# Patient Record
Sex: Male | Born: 1980 | Race: White | Hispanic: No | Marital: Single | Smoking: Current every day smoker
Health system: Southern US, Community
[De-identification: ages and names within clinical notes are randomized; demographics above are authoritative.]

## PROBLEM LIST (undated history)

## (undated) ENCOUNTER — Emergency Department: Admission: EM | Payer: Self-pay | Source: Home / Self Care

## (undated) DIAGNOSIS — K449 Diaphragmatic hernia without obstruction or gangrene: Secondary | ICD-10-CM

## (undated) DIAGNOSIS — M549 Dorsalgia, unspecified: Secondary | ICD-10-CM

## (undated) DIAGNOSIS — G8929 Other chronic pain: Secondary | ICD-10-CM

---

## 2002-11-19 ENCOUNTER — Emergency Department (HOSPITAL_COMMUNITY): Admission: EM | Admit: 2002-11-19 | Discharge: 2002-11-19 | Payer: Self-pay

## 2002-12-01 ENCOUNTER — Emergency Department (HOSPITAL_COMMUNITY): Admission: EM | Admit: 2002-12-01 | Discharge: 2002-12-02 | Payer: Self-pay | Admitting: Emergency Medicine

## 2002-12-26 ENCOUNTER — Emergency Department (HOSPITAL_COMMUNITY): Admission: EM | Admit: 2002-12-26 | Discharge: 2002-12-26 | Payer: Self-pay

## 2003-09-01 ENCOUNTER — Other Ambulatory Visit: Payer: Self-pay

## 2004-05-05 ENCOUNTER — Other Ambulatory Visit: Payer: Self-pay

## 2004-05-05 ENCOUNTER — Emergency Department: Payer: Self-pay | Admitting: Emergency Medicine

## 2004-05-06 ENCOUNTER — Emergency Department: Payer: Self-pay | Admitting: Emergency Medicine

## 2004-06-08 ENCOUNTER — Emergency Department: Payer: Self-pay | Admitting: Emergency Medicine

## 2005-07-19 ENCOUNTER — Emergency Department: Payer: Self-pay | Admitting: Emergency Medicine

## 2006-04-12 ENCOUNTER — Other Ambulatory Visit: Payer: Self-pay

## 2006-04-12 ENCOUNTER — Emergency Department: Payer: Self-pay | Admitting: Emergency Medicine

## 2006-04-13 ENCOUNTER — Ambulatory Visit: Payer: Self-pay | Admitting: Emergency Medicine

## 2007-07-12 ENCOUNTER — Emergency Department: Payer: Self-pay | Admitting: Emergency Medicine

## 2008-11-25 ENCOUNTER — Emergency Department: Payer: Self-pay | Admitting: Emergency Medicine

## 2008-12-10 ENCOUNTER — Emergency Department: Payer: Self-pay | Admitting: Emergency Medicine

## 2008-12-26 ENCOUNTER — Emergency Department: Payer: Self-pay | Admitting: Emergency Medicine

## 2010-12-16 ENCOUNTER — Emergency Department: Payer: Self-pay | Admitting: Emergency Medicine

## 2011-12-07 ENCOUNTER — Emergency Department: Payer: Self-pay | Admitting: Emergency Medicine

## 2012-11-28 ENCOUNTER — Emergency Department: Payer: Self-pay | Admitting: Internal Medicine

## 2012-11-28 LAB — CBC
HCT: 44.3 % (ref 40.0–52.0)
HGB: 15.3 g/dL (ref 13.0–18.0)
MCHC: 34.5 g/dL (ref 32.0–36.0)
MCV: 85 fL (ref 80–100)
Platelet: 260 10*3/uL (ref 150–440)
RBC: 5.19 10*6/uL (ref 4.40–5.90)
RDW: 13.2 % (ref 11.5–14.5)

## 2012-11-29 ENCOUNTER — Emergency Department: Payer: Self-pay | Admitting: Emergency Medicine

## 2013-03-03 ENCOUNTER — Emergency Department: Payer: Self-pay | Admitting: Emergency Medicine

## 2013-03-03 LAB — CBC WITH DIFFERENTIAL/PLATELET
Basophil #: 0.1 10*3/uL (ref 0.0–0.1)
Basophil %: 1.1 %
Eosinophil #: 0.4 10*3/uL (ref 0.0–0.7)
Eosinophil %: 5.6 %
HGB: 14.7 g/dL (ref 13.0–18.0)
Lymphocyte %: 24.5 %
MCHC: 35.5 g/dL (ref 32.0–36.0)
MCV: 86 fL (ref 80–100)
Monocyte %: 8.7 %
Platelet: 219 10*3/uL (ref 150–440)
RBC: 4.83 10*6/uL (ref 4.40–5.90)
RDW: 12.2 % (ref 11.5–14.5)

## 2013-03-03 LAB — BASIC METABOLIC PANEL
BUN: 12 mg/dL (ref 7–18)
Calcium, Total: 9.1 mg/dL (ref 8.5–10.1)
Co2: 29 mmol/L (ref 21–32)
EGFR (African American): >60
EGFR (Non-African Amer.): >60
Osmolality: 283 (ref 275–301)
Potassium: 3.6 mmol/L (ref 3.5–5.1)

## 2014-01-12 ENCOUNTER — Emergency Department: Payer: Self-pay | Admitting: Emergency Medicine

## 2014-01-22 ENCOUNTER — Emergency Department: Payer: Self-pay | Admitting: Emergency Medicine

## 2014-01-24 ENCOUNTER — Ambulatory Visit: Payer: Self-pay | Admitting: Podiatry

## 2014-06-18 ENCOUNTER — Emergency Department: Payer: Self-pay | Admitting: Emergency Medicine

## 2014-08-28 ENCOUNTER — Emergency Department
Admit: 2014-08-28 | Disposition: A | Discharge status: Home / Self Care | Payer: Self-pay | Admitting: Emergency Medicine

## 2014-09-26 ENCOUNTER — Emergency Department
Admission: EM | Admit: 2014-09-26 | Discharge: 2014-09-26 | Disposition: A | Discharge status: Home / Self Care | Payer: Medicaid Other | Attending: Emergency Medicine | Admitting: Emergency Medicine

## 2014-09-26 ENCOUNTER — Encounter: Payer: Self-pay | Admitting: *Deleted

## 2014-09-26 DIAGNOSIS — Y9289 Other specified places as the place of occurrence of the external cause: Secondary | ICD-10-CM | POA: Diagnosis not present

## 2014-09-26 DIAGNOSIS — Z79899 Other long term (current) drug therapy: Secondary | ICD-10-CM | POA: Diagnosis not present

## 2014-09-26 DIAGNOSIS — S29019A Strain of muscle and tendon of unspecified wall of thorax, initial encounter: Secondary | ICD-10-CM

## 2014-09-26 DIAGNOSIS — Y990 Civilian activity done for income or pay: Secondary | ICD-10-CM | POA: Insufficient documentation

## 2014-09-26 DIAGNOSIS — Y9389 Activity, other specified: Secondary | ICD-10-CM | POA: Diagnosis not present

## 2014-09-26 DIAGNOSIS — G8929 Other chronic pain: Secondary | ICD-10-CM

## 2014-09-26 DIAGNOSIS — M542 Cervicalgia: Secondary | ICD-10-CM | POA: Insufficient documentation

## 2014-09-26 DIAGNOSIS — X58XXXA Exposure to other specified factors, initial encounter: Secondary | ICD-10-CM | POA: Diagnosis not present

## 2014-09-26 DIAGNOSIS — M549 Dorsalgia, unspecified: Secondary | ICD-10-CM

## 2014-09-26 DIAGNOSIS — Z720 Tobacco use: Secondary | ICD-10-CM | POA: Diagnosis not present

## 2014-09-26 DIAGNOSIS — M546 Pain in thoracic spine: Secondary | ICD-10-CM | POA: Diagnosis present

## 2014-09-26 DIAGNOSIS — S29012A Strain of muscle and tendon of back wall of thorax, initial encounter: Secondary | ICD-10-CM | POA: Diagnosis not present

## 2014-09-26 MED ORDER — KETOROLAC TROMETHAMINE 60 MG/2ML IM SOLN
60.0000 mg | Freq: Once | INTRAMUSCULAR | Status: AC
  Administered 2014-09-26: 60 mg via INTRAMUSCULAR

## 2014-09-26 MED ORDER — HYDROMORPHONE HCL 1 MG/ML IJ SOLN
INTRAMUSCULAR | Status: AC
  Filled 2014-09-26: qty 1

## 2014-09-26 MED ORDER — KETOROLAC TROMETHAMINE 60 MG/2ML IM SOLN
INTRAMUSCULAR | Status: AC
Start: 2014-09-26 — End: 2014-09-26
  Filled 2014-09-26: qty 2

## 2014-09-26 MED ORDER — HYDROMORPHONE HCL 1 MG/ML IJ SOLN
1.0000 mg | Freq: Once | INTRAMUSCULAR | Status: AC
Start: 2014-09-26 — End: 2014-09-26
  Administered 2014-09-26: 1 mg via INTRAMUSCULAR

## 2014-09-26 MED ORDER — ORPHENADRINE CITRATE 30 MG/ML IJ SOLN
INTRAMUSCULAR | Status: AC
  Filled 2014-09-26: qty 2

## 2014-09-26 MED ORDER — METHOCARBAMOL 750 MG PO TABS: 750.0000 mg | ORAL_TABLET | Freq: Four times a day (QID) | ORAL | Status: DC

## 2014-09-26 MED ORDER — ORPHENADRINE CITRATE 30 MG/ML IJ SOLN
60.0000 mg | Freq: Once | INTRAMUSCULAR | Status: AC
  Administered 2014-09-26: 60 mg via INTRAMUSCULAR

## 2014-09-26 MED ORDER — IBUPROFEN 800 MG PO TABS: 800.0000 mg | ORAL_TABLET | Freq: Three times a day (TID) | ORAL | Status: DC | PRN

## 2014-09-26 MED ORDER — TRAMADOL HCL 50 MG PO TABS: 50.0000 mg | ORAL_TABLET | Freq: Four times a day (QID) | ORAL | Status: DC | PRN

## 2014-09-26 NOTE — ED Notes (Signed)
Pt here with c/o "pinched nerve" in neck.  Advises his neck and upper back started hurting last night.  Advises he has had this happen before.

## 2014-09-26 NOTE — ED Notes (Signed)
Pt expressed request for more pain meds at this time, states it did not help the pain. PA Ron made aware and no further orders given at this time.

## 2014-09-26 NOTE — ED Notes (Signed)
Pt placed on med hold. Verbalized understanding, will be d/c at 1430.

## 2014-09-26 NOTE — ED Notes (Signed)
Complaining of neck pain. Indicates it is on the neck and across the shoulders. Difficulty raising arms above head.

## 2014-09-26 NOTE — Discharge Instructions (Signed)
Take medications as directed and follow up with [facility].

## 2014-09-26 NOTE — ED Provider Notes (Signed)
[facility] Emergency Department Provider Note  ____________________________________________  Time seen: Approximately 1:39 PM  I have reviewed the triage vital signs and the nursing notes.   HISTORY  Chief Complaint Back Pain    HPI Maclin Lacretia NicksW Laveda Normanoney is a 34 y.o. male complaining of neck and upper back pain for 2 days. Patient cannot give aprovocative incident but works as a Corporate investment bankerconstruction worker. States the pain is at the base of his neck radiating to both of the scapular area and down to mid back. Patient is rating the pain as a 7/10. Patient states pain increases with movement of the upper extremities over his head. Patient denies any loss of sensation or loss of strength. Patient is concerned because this is the third incident in the past year for this complaint   History reviewed. No pertinent past medical history.  There are no active problems to display for this patient.   History reviewed. No pertinent past surgical history.  Current Outpatient Rx  Name  Route  Sig  Dispense  Refill  . ibuprofen (ADVIL,MOTRIN) 800 MG tablet   Oral   Take 1 tablet (800 mg total) by mouth every 8 (eight) hours as needed for moderate pain.   15 tablet   0   . methocarbamol (ROBAXIN-750) 750 MG tablet   Oral   Take 1 tablet (750 mg total) by mouth 4 (four) times daily.   20 tablet   0   . traMADol (ULTRAM) 50 MG tablet   Oral   Take 1 tablet (50 mg total) by mouth every 6 (six) hours as needed for moderate pain.   12 tablet   0     Allergies Review of patient's allergies indicates no known allergies.  No family history on file.  Social History History  Substance Use Topics  . Smoking status: Current Every Day Smoker  . Smokeless tobacco: Never Used  . Alcohol Use: Yes     Comment: social    Review of Systems Constitutional: No fever/chills Eyes: No visual changes. ENT: No sore throat. Cardiovascular: Denies chest pain. Respiratory: Denies  shortness of breath. Gastrointestinal: No abdominal pain.  No nausea, no vomiting.  No diarrhea.  No constipation. Genitourinary: Negative for dysuria. Musculoskeletal: Positive for bilateral scapular pain and midthoracic pain. Skin: Negative for rash. Neurological: Negative for headaches, focal weakness or numbness. Psychiatric:Negative Endocrine:Negative Hematological/Lymphatic: Allergic/Immunilogical: Negative 10-point ROS otherwise negative.  ____________________________________________   PHYSICAL EXAM:  VITAL SIGNS: ED Triage Vitals  Enc Vitals Group     BP 09/26/14 1209 114/88 mmHg     Pulse Rate 09/26/14 1209 76     Resp --      Temp 09/26/14 1209 98.1 F (36.7 C)     Temp Source 09/26/14 1209 Oral     SpO2 09/26/14 1209 100 %     Weight 09/26/14 1209 205 lb (92.987 kg)     Height 09/26/14 1209 5\' 9"  (1.753 m)     Head Cir --      Peak Flow --      Pain Score 09/26/14 1210 7     Pain Loc --      Pain Edu? --      Excl. in GC? --     Constitutional: Alert and oriented. Well appearing and in no acute distress. Eyes: Conjunctivae are normal. PERRL. EOMI. Head: Atraumatic. Nose: No congestion/rhinnorhea. Mouth/Throat: Mucous membranes are moist.  Oropharynx non-erythematous. Neck: No stridor.  Decreased range of motion with lateral movement  secondary to complain of pain. Hematological/Lymphatic/Immunilogical: No cervical lymphadenopathy. Cardiovascular: Normal rate, regular rhythm. Grossly normal heart sounds.  Good peripheral circulation. Respiratory: Normal respiratory effort.  No retractions. Lungs CTAB. Gastrointestinal: Soft and nontender. No distention. No abdominal bruits. No CVA tenderness. Genitourinary: Not examined Musculoskeletal: No obvious deformity of the extremity decreased range of motion of overhead reaching. Guarding with  palpation bilateral scapular area. No lower extremity tenderness nor edema.  No joint effusions. Neurologic:  Normal  speech and language. No gross focal neurologic deficits are appreciated. Speech is normal. No gait instability. Skin:  Skin is warm, dry and intact. No rash noted. Psychiatric: Mood and affect are normal. Speech and behavior are normal.  ____________________________________________   LABS (all labs ordered are listed, but only abnormal results are displayed)  Labs Reviewed - No data to display ____________________________________________  EKG   ____________________________________________  RADIOLOGY   ____________________________________________   PROCEDURES  Procedure(s) performed: None  Critical Care performed: No  ____________________________________________   INITIAL IMPRESSION / ASSESSMENT AND PLAN / ED COURSE  Pertinent labs & imaging results that were available during my care of the patient were reviewed by me and considered in my medical decision making (see chart for details).  Muscle strain upper back ____________________________________________   FINAL CLINICAL IMPRESSION(S) / ED DIAGNOSES  Final diagnoses:  Thoracic myofascial strain, initial encounter  Chronic back pain greater than 3 months duration      Joni ReiningRonald K Makina Skow, PA-C 09/26/14 1351  Sharman CheekPhillip Stafford, MD 09/27/14 [phone]

## 2015-01-03 ENCOUNTER — Encounter: Payer: Self-pay | Admitting: Emergency Medicine

## 2015-01-03 ENCOUNTER — Emergency Department: Payer: Medicaid Other

## 2015-01-03 ENCOUNTER — Emergency Department
Admission: EM | Admit: 2015-01-03 | Discharge: 2015-01-03 | Disposition: A | Discharge status: Home / Self Care | Payer: Medicaid Other | Attending: Emergency Medicine | Admitting: Emergency Medicine

## 2015-01-03 ENCOUNTER — Other Ambulatory Visit: Payer: Self-pay

## 2015-01-03 DIAGNOSIS — S46912A Strain of unspecified muscle, fascia and tendon at shoulder and upper arm level, left arm, initial encounter: Secondary | ICD-10-CM | POA: Insufficient documentation

## 2015-01-03 DIAGNOSIS — Z720 Tobacco use: Secondary | ICD-10-CM | POA: Diagnosis not present

## 2015-01-03 DIAGNOSIS — S4992XA Unspecified injury of left shoulder and upper arm, initial encounter: Secondary | ICD-10-CM | POA: Diagnosis present

## 2015-01-03 DIAGNOSIS — Y9289 Other specified places as the place of occurrence of the external cause: Secondary | ICD-10-CM | POA: Diagnosis not present

## 2015-01-03 DIAGNOSIS — Z79899 Other long term (current) drug therapy: Secondary | ICD-10-CM | POA: Insufficient documentation

## 2015-01-03 DIAGNOSIS — Y990 Civilian activity done for income or pay: Secondary | ICD-10-CM | POA: Insufficient documentation

## 2015-01-03 DIAGNOSIS — Y9389 Activity, other specified: Secondary | ICD-10-CM | POA: Diagnosis not present

## 2015-01-03 DIAGNOSIS — X58XXXA Exposure to other specified factors, initial encounter: Secondary | ICD-10-CM | POA: Diagnosis not present

## 2015-01-03 MED ORDER — NAPROXEN 500 MG PO TBEC: 500.0000 mg | DELAYED_RELEASE_TABLET | Freq: Two times a day (BID) | ORAL | Status: DC

## 2015-01-03 MED ORDER — CYCLOBENZAPRINE HCL 10 MG PO TABS: 10.0000 mg | ORAL_TABLET | Freq: Three times a day (TID) | ORAL | Status: DC | PRN

## 2015-01-03 MED ORDER — OXYCODONE-ACETAMINOPHEN 5-325 MG PO TABS: 1.0000 | ORAL_TABLET | ORAL | Status: DC | PRN

## 2015-01-03 NOTE — ED Notes (Addendum)
Patient to ED with c/o left shoulder pain that radiates down into left arm, left back and left side of chest. Patient reports this pain started while he was at work yesterday, works [employer] representative. Pain is worse with movement.

## 2015-01-03 NOTE — Discharge Instructions (Signed)

## 2015-01-03 NOTE — ED Provider Notes (Signed)
[facility] Emergency Department Provider Note  ____________________________________________  Time seen: Approximately 6:22 PM  I have reviewed the triage vital signs and the nursing notes.   HISTORY  Chief Complaint Shoulder Pain    HPI Shawn Green is a 34 y.o. male presents for evaluation of left shoulder pain times this morning. 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 Patient states pain is worse with movement. Denies any chest pain or cardiac pain.   History reviewed. No pertinent past medical history.  There are no active problems to display for this patient.   History reviewed. No pertinent past surgical history.  Current Outpatient Rx  Name  Route  Sig  Dispense  Refill  . cyclobenzaprine (FLEXERIL) 10 MG tablet   Oral   Take 1 tablet (10 mg total) by mouth every 8 (eight) hours as needed for muscle spasms.   30 tablet   1   . ibuprofen (ADVIL,MOTRIN) 800 MG tablet   Oral   Take 1 tablet (800 mg total) by mouth every 8 (eight) hours as needed for moderate pain.   15 tablet   0   . methocarbamol (ROBAXIN-750) 750 MG tablet   Oral   Take 1 tablet (750 mg total) by mouth 4 (four) times daily.   20 tablet   0   . naproxen (EC NAPROSYN) 500 MG EC tablet   Oral   Take 1 tablet (500 mg total) by mouth 2 (two) times daily with a meal.   60 tablet   0   . oxyCODONE-acetaminophen (ROXICET) 5-325 MG per tablet   Oral   Take 1-2 tablets by mouth every 4 (four) hours as needed for severe pain.   15 tablet   0   . traMADol (ULTRAM) 50 MG tablet   Oral   Take 1 tablet (50 mg total) by mouth every 6 (six) hours as needed for moderate pain.   12 tablet   0     Allergies Review of patient's allergies indicates no known allergies.  History reviewed. No pertinent family history.  Social History Social History  Substance Use Topics  . Smoking status: Current Every Day Smoker  . Smokeless tobacco: Never  Used  . Alcohol Use: Yes     Comment: social    Review of Systems Constitutional: No fever/chills Eyes: No visual changes. ENT: No sore throat. Cardiovascular: Denies chest pain. Respiratory: Denies shortness of breath. Gastrointestinal: No abdominal pain.  No nausea, no vomiting.  No diarrhea.  No constipation. Genitourinary: Negative for dysuria. Musculoskeletal: Positive left shoulder pain Skin: Negative for rash. Neurological: Negative for headaches, focal weakness or numbness.  10-point ROS otherwise negative.  ____________________________________________   PHYSICAL EXAM:  VITAL SIGNS: ED Triage Vitals  Enc Vitals Group     BP 01/03/15 1658 117/77 mmHg     Pulse Rate 01/03/15 1658 91     Resp 01/03/15 1658 20     Temp 01/03/15 1658 97.8 F (36.6 C)     Temp Source 01/03/15 1658 Oral     SpO2 01/03/15 1658 99 %     Weight 01/03/15 1658 205 lb (92.987 kg)     Height 01/03/15 1658 5\' 9"  (1.753 m)     Head Cir --      Peak Flow --      Pain Score 01/03/15 1659 7     Pain Loc --      Pain Edu? --  Excl. in GC? --     Constitutional: Alert and oriented. Well appearing and in no acute distress. Eyes: Conjunctivae are normal. PERRL. EOMI. Head: Atraumatic. Nose: No congestion/rhinnorhea. Mouth/Throat: Mucous membranes are moist.  Oropharynx non-erythematous. Neck: No stridor.   Cardiovascular: Normal rate, regular rhythm. Grossly normal heart sounds.  Good peripheral circulation. Respiratory: Normal respiratory effort.  No retractions. Lungs CTAB. Gastrointestinal: Soft and nontender. No distention. No abdominal bruits. No CVA tenderness. Musculoskeletal: Shoulder pendulum range of motion. Increased pain with abduction and extension Neurologic:  Normal speech and language. No gross focal neurologic deficits are appreciated. No gait instability. Skin:  Skin is warm, dry and intact. No rash noted. Psychiatric: Mood and affect are normal. Speech and behavior are  normal.  ____________________________________________   LABS (all labs ordered are listed, but only abnormal results are displayed)  Labs Reviewed - No data to display ____________________________________________   RADIOLOGY  Left shoulder radiology films were negative interpreted by radiologist and reviewed by myself. ____________________________________________   PROCEDURES  Procedure(s) performed: None  Critical Care performed: No  ____________________________________________   INITIAL IMPRESSION / ASSESSMENT AND PLAN / ED COURSE  Pertinent labs & imaging results that were available during my care of the patient were reviewed by me and considered in my medical decision making (see chart for details).  Acute left shoulder strain. Rx given for Naprosyn twice a day, Flexeril 10 mg 3 times a day. Work note for 1 day given. Patient follow-up with PCP or return to the ER as needed.  Patient voices no other emergency medical complaints at this visit. ____________________________________________   FINAL CLINICAL IMPRESSION(S) / ED DIAGNOSES  Final diagnoses:  Left shoulder strain, initial encounter      Evangeline Dakin, PA-C 01/03/15 1836  Phineas Semen, MD 01/03/15 [phone]

## 2015-03-03 ENCOUNTER — Encounter: Payer: Self-pay | Admitting: Emergency Medicine

## 2015-03-03 ENCOUNTER — Emergency Department
Admission: EM | Admit: 2015-03-03 | Discharge: 2015-03-03 | Disposition: A | Discharge status: Home / Self Care | Payer: Medicaid Other | Attending: Emergency Medicine | Admitting: Emergency Medicine

## 2015-03-03 DIAGNOSIS — G8929 Other chronic pain: Secondary | ICD-10-CM | POA: Diagnosis not present

## 2015-03-03 DIAGNOSIS — M549 Dorsalgia, unspecified: Secondary | ICD-10-CM | POA: Diagnosis present

## 2015-03-03 DIAGNOSIS — Z791 Long term (current) use of non-steroidal anti-inflammatories (NSAID): Secondary | ICD-10-CM | POA: Insufficient documentation

## 2015-03-03 DIAGNOSIS — Z720 Tobacco use: Secondary | ICD-10-CM | POA: Diagnosis not present

## 2015-03-03 DIAGNOSIS — M62830 Muscle spasm of back: Secondary | ICD-10-CM | POA: Diagnosis not present

## 2015-03-03 DIAGNOSIS — Z79899 Other long term (current) drug therapy: Secondary | ICD-10-CM | POA: Insufficient documentation

## 2015-03-03 HISTORY — DX: Other chronic pain: G89.29

## 2015-03-03 HISTORY — DX: Dorsalgia, unspecified: M54.9

## 2015-03-03 MED ORDER — OXYCODONE-ACETAMINOPHEN 5-325 MG PO TABS: 1.0000 | ORAL_TABLET | Freq: Four times a day (QID) | ORAL | Status: DC | PRN

## 2015-03-03 MED ORDER — MELOXICAM 15 MG PO TABS: 15.0000 mg | ORAL_TABLET | Freq: Every day | ORAL | Status: DC

## 2015-03-03 MED ORDER — CYCLOBENZAPRINE HCL 10 MG PO TABS: 10.0000 mg | ORAL_TABLET | Freq: Three times a day (TID) | ORAL | Status: DC | PRN

## 2015-03-03 NOTE — ED Provider Notes (Signed)
[facility] Emergency Department Provider Note  ____________________________________________  Time seen: Approximately 2:52 PM  I have reviewed the triage vital signs and the nursing notes.   HISTORY  Chief Complaint Back Pain    HPI Shawn Green is a 34 y.o. male who presents emergency department with a complaint of back pain. He states that he has chronic back pain issues after injuring he was "cutting trees". States that he routinely will have increased symptoms. He denies any recent injury. He denies any activity out of the ordinary. He denies any numbness or tingling. He states that the pain runs from mid back to his lower back. He states that the pain is a cramping/burning sensation. He reports it as severe with any movement.   Past Medical History  Diagnosis Date  . Chronic back pain     There are no active problems to display for this patient.   History reviewed. No pertinent past surgical history.  Current Outpatient Rx  Name  Route  Sig  Dispense  Refill  . cyclobenzaprine (FLEXERIL) 10 MG tablet   Oral   Take 1 tablet (10 mg total) by mouth 3 (three) times daily as needed for muscle spasms.   15 tablet   0   . ibuprofen (ADVIL,MOTRIN) 800 MG tablet   Oral   Take 1 tablet (800 mg total) by mouth every 8 (eight) hours as needed for moderate pain.   15 tablet   0   . meloxicam (MOBIC) 15 MG tablet   Oral   Take 1 tablet (15 mg total) by mouth daily.   30 tablet   0   . methocarbamol (ROBAXIN-750) 750 MG tablet   Oral   Take 1 tablet (750 mg total) by mouth 4 (four) times daily.   20 tablet   0   . naproxen (EC NAPROSYN) 500 MG EC tablet   Oral   Take 1 tablet (500 mg total) by mouth 2 (two) times daily with a meal.   60 tablet   0   . oxyCODONE-acetaminophen (ROXICET) 5-325 MG tablet   Oral   Take 1 tablet by mouth every 6 (six) hours as needed for severe pain.   10 tablet   0   . traMADol (ULTRAM) 50 MG tablet    Oral   Take 1 tablet (50 mg total) by mouth every 6 (six) hours as needed for moderate pain.   12 tablet   0     Allergies Review of patient's allergies indicates no known allergies.  History reviewed. No pertinent family history.  Social History Social History  Substance Use Topics  . Smoking status: Current Every Day Smoker  . Smokeless tobacco: Never Used  . Alcohol Use: Yes     Comment: social    Review of Systems Constitutional: No fever/chills Eyes: No visual changes. ENT: No sore throat. Cardiovascular: Denies chest pain. Respiratory: Denies shortness of breath. Gastrointestinal: No abdominal pain.  No nausea, no vomiting.  No diarrhea.  No constipation. Genitourinary: Negative for dysuria. Musculoskeletal: Endorses for back pain. Skin: Negative for rash. Neurological: Negative for headaches, focal weakness or numbness.  10-point ROS otherwise negative.  ____________________________________________   PHYSICAL EXAM:  VITAL SIGNS: ED Triage Vitals  Enc Vitals Group     BP 03/03/15 1429 107/65 mmHg     Pulse Rate 03/03/15 1429 95     Resp 03/03/15 1429 18     Temp 03/03/15 1429 98.6 F (37 C)  Temp Source 03/03/15 1429 Oral     SpO2 03/03/15 1429 99 %     Weight 03/03/15 1429 200 lb (90.719 kg)     Height 03/03/15 1429 5\' 9"  (1.753 m)     Head Cir --      Peak Flow --      Pain Score 03/03/15 1430 8     Pain Loc --      Pain Edu? --      Excl. in GC? --     Constitutional: Alert and oriented. Well appearing and in no acute distress. Eyes: Conjunctivae are normal. PERRL. EOMI. Head: Atraumatic. Nose: No congestion/rhinnorhea. Mouth/Throat: Mucous membranes are moist.  Oropharynx non-erythematous. Neck: No stridor.  No cervical spine tenderness to palpation. Cardiovascular: Normal rate, regular rhythm. Grossly normal heart sounds.  Good peripheral circulation. Respiratory: Normal respiratory effort.  No retractions. Lungs  CTAB. Gastrointestinal: Soft and nontender. No distention. No abdominal bruits. No CVA tenderness. Musculoskeletal: No lower extremity tenderness nor edema.  No joint effusions. Diffuse tenderness from the lumbar sacral region in the paraspinal muscles. No tenderness to palpation over spinal processes. No straight leg raise bilaterally. Sensation intact bilaterally. Pulses intact lower extremities bilaterally. Neurologic:  Normal speech and language. No gross focal neurologic deficits are appreciated. No gait instability. Skin:  Skin is warm, dry and intact. No rash noted. Psychiatric: Mood and affect are normal. Speech and behavior are normal.  ____________________________________________   LABS (all labs ordered are listed, but only abnormal results are displayed)  Labs Reviewed - No data to display ____________________________________________  EKG   ____________________________________________  RADIOLOGY   ____________________________________________   PROCEDURES  Procedure(s) performed: None  Critical Care performed: No  ____________________________________________   INITIAL IMPRESSION / ASSESSMENT AND PLAN / ED COURSE  Pertinent labs & imaging results that were available during my care of the patient were reviewed by me and considered in my medical decision making (see chart for details).  Patient's history, symptoms, and physical exam are consistent with muscle spasms in the lumbar and sacral paraspinal muscle groups. The patient will be placed on medications to relieve symptoms. I advised the patient to establish with primary care, orthopedics, or chiropractor for continued treatment. The patient verbalizes understanding and compliance with treatment plan. ____________________________________________   FINAL CLINICAL IMPRESSION(S) / ED DIAGNOSES  Final diagnoses:  Spasm of paraspinal muscle      Racheal PatchesJonathan D Semisi Biela, PA-C 03/03/15 1518  Darien Ramusavid W Kaminski,  MD 03/03/15 2046

## 2015-03-03 NOTE — ED Notes (Signed)
Reports chronic back pain, worse today. 

## 2015-03-03 NOTE — ED Notes (Signed)
NAD noted at time of D/C. Pt denies questions or concerns. Pt ambulatory to the lobby at this time.  

## 2015-03-03 NOTE — Discharge Instructions (Signed)
Back Exercises The following exercises strengthen the muscles that help to support the back. They also help to keep the lower back flexible. Doing these exercises can help to prevent back pain or lessen existing pain. If you have back pain or discomfort, try doing these exercises 2-3 times each day or as told by your health care provider. When the pain goes away, do them once each day, but increase the number of times that you repeat the steps for each exercise (do more repetitions). If you do not have back pain or discomfort, do these exercises once each day or as told by your health care provider. EXERCISES Single Knee to Chest Repeat these steps 3-5 times for each leg:  Lie on your back on a firm bed or the floor with your legs extended.  Bring one knee to your chest. Your other leg should stay extended and in contact with the floor.  Hold your knee in place by grabbing your knee or thigh.  Pull on your knee until you feel a gentle stretch in your lower back.  Hold the stretch for 10-30 seconds.  Slowly release and straighten your leg. Pelvic Tilt Repeat these steps 5-10 times:  Lie on your back on a firm bed or the floor with your legs extended.  Bend your knees so they are pointing toward the ceiling and your feet are flat on the floor.  Tighten your lower abdominal muscles to press your lower back against the floor. This motion will tilt your pelvis so your tailbone points up toward the ceiling instead of pointing to your feet or the floor.  With gentle tension and even breathing, hold this position for 5-10 seconds. Cat-Cow Repeat these steps until your lower back becomes more flexible:  Get into a hands-and-knees position on a firm surface. Keep your hands under your shoulders, and keep your knees under your hips. You may place padding under your knees for comfort.  Let your head hang down, and point your tailbone toward the floor so your lower back becomes rounded like the  back of a cat.  Hold this position for 5 seconds.  Slowly lift your head and point your tailbone up toward the ceiling so your back forms a sagging arch like the back of a cow.  Hold this position for 5 seconds. Press-Ups Repeat these steps 5-10 times:  Lie on your abdomen (face-down) on the floor.  Place your palms near your head, about shoulder-width apart.  While you keep your back as relaxed as possible and keep your hips on the floor, slowly straighten your arms to raise the top half of your body and lift your shoulders. Do not use your back muscles to raise your upper torso. You may adjust the placement of your hands to make yourself more comfortable.  Hold this position for 5 seconds while you keep your back relaxed.  Slowly return to lying flat on the floor. Bridges Repeat these steps 10 times:  Lie on your back on a firm surface.  Bend your knees so they are pointing toward the ceiling and your feet are flat on the floor.  Tighten your buttocks muscles and lift your buttocks off of the floor until your waist is at almost the same height as your knees. You should feel the muscles working in your buttocks and the back of your thighs. If you do not feel these muscles, slide your feet 1-2 inches farther away from your buttocks.  Hold this position for 3-5  seconds.  Slowly lower your hips to the starting position, and allow your buttocks muscles to relax completely. If this exercise is too easy, try doing it with your arms crossed over your chest. Abdominal Crunches Repeat these steps 5-10 times:  Lie on your back on a firm bed or the floor with your legs extended.  Bend your knees so they are pointing toward the ceiling and your feet are flat on the floor.  Cross your arms over your chest.  Tip your chin slightly toward your chest without bending your neck.  Tighten your abdominal muscles and slowly raise your trunk (torso) high enough to lift your shoulder blades a  tiny bit off of the floor. Avoid raising your torso higher than that, because it can put too much stress on your low back and it does not help to strengthen your abdominal muscles.  Slowly return to your starting position. Back Lifts Repeat these steps 5-10 times: 1. Lie on your abdomen (face-down) with your arms at your sides, and rest your forehead on the floor. 2. Tighten the muscles in your legs and your buttocks. 3. Slowly lift your chest off of the floor while you keep your hips pressed to the floor. Keep the back of your head in line with the curve in your back. Your eyes should be looking at the floor. 4. Hold this position for 3-5 seconds. 5. Slowly return to your starting position. SEEK MEDICAL CARE IF:  Your back pain or discomfort gets much worse when you do an exercise.  Your back pain or discomfort does not lessen within 2 hours after you exercise. If you have any of these problems, stop doing these exercises right away. Do not do them again unless your health care provider says that you can. SEEK IMMEDIATE MEDICAL CARE IF:  You develop sudden, severe back pain. If this happens, stop doing the exercises right away. Do not do them again unless your health care provider says that you can.   This information is not intended to replace advice given to you by your health care provider. Make sure you discuss any questions you have with your health care provider.   Document Released: 06/12/2004 Document Revised: 01/24/2015 Document Reviewed: 06/29/2014 Elsevier Interactive Patient Education 2016 [place] Injury Prevention Back injuries can be very painful. They can also be difficult to heal. After having one back injury, you are more likely to injure your back again. It is important to learn how to avoid injuring or re-injuring your back. The following tips can help you to prevent a back injury. WHAT SHOULD I KNOW ABOUT PHYSICAL FITNESS?  Exercise for 30 minutes per  day on most days of the week or as directed by your health care provider. Make sure to:  Do aerobic exercises, such as walking, jogging, biking, or swimming.  Do exercises that increase balance and strength, such as tai chi and yoga. These can decrease your risk of falling and injuring your back.  Do stretching exercises to help with flexibility.  Try to develop strong abdominal muscles. Your abdominal muscles provide a lot of the support that is needed by your back.  Maintain a healthy weight. This helps to decrease your risk of a back injury. WHAT SHOULD I KNOW ABOUT MY DIET?  Talk with your health care provider about your overall diet. Take supplements and vitamins only as directed by your health care provider.  Talk with your health care provider about how much calcium and  vitamin D you need each day. These nutrients help to prevent weakening of the bones (osteoporosis). Osteoporosis can cause broken (fractured) bones, which lead to back pain.  Include good sources of calcium in your diet, such as dairy products, green leafy vegetables, and products that have had calcium added to them (fortified).  Include good sources of vitamin D in your diet, such as milk and foods that are fortified with vitamin D. WHAT SHOULD I KNOW ABOUT MY POSTURE?  Sit up straight and stand up straight. Avoid leaning forward when you sit or hunching over when you stand.  Choose chairs that have good low-back (lumbar) support.  If you work at a desk, sit close to it so you do not need to lean over. Keep your chin tucked in. Keep your neck drawn back, and keep your elbows bent at a right angle. Your arms should look like the letter "L."  Sit high and close to the steering wheel when you drive. Add a lumbar support to your car seat, if needed.  Avoid sitting or standing in one position for very long. Take breaks to get up, stretch, and walk around at least one time every hour. Take breaks every hour if you are  driving for long periods of time.  Sleep on your side with your knees slightly bent, or sleep on your back with a pillow under your knees. Do not lie on the front of your body to sleep. WHAT SHOULD I KNOW ABOUT LIFTING, TWISTING, AND REACHING? Lifting and Heavy Lifting  Avoid heavy lifting, especially repetitive heavy lifting. If you must do heavy lifting:  Stretch before lifting.  Work slowly.  Rest between lifts.  Use a tool such as a cart or a dolly to move objects if one is available.  Make several small trips instead of carrying one heavy load.  Ask for help when you need it, especially when moving big objects.  Follow these steps when lifting:  Stand with your feet shoulder-width apart.  Get as close to the object as you can. Do not try to pick up a heavy object that is far from your body.  Use handles or lifting straps if they are available.  Bend at your knees. Squat down, but keep your heels off the floor.  Keep your shoulders pulled back, your chin tucked in, and your back straight.  Lift the object slowly while you tighten the muscles in your legs, abdomen, and buttocks. Keep the object as close to the center of your body as possible.  Follow these steps when putting down a heavy load:  Stand with your feet shoulder-width apart.  Lower the object slowly while you tighten the muscles in your legs, abdomen, and buttocks. Keep the object as close to the center of your body as possible.  Keep your shoulders pulled back, your chin tucked in, and your back straight.  Bend at your knees. Squat down, but keep your heels off the floor.  Use handles or lifting straps if they are available. Twisting and Reaching  Avoid lifting heavy objects above your waist.  Do not twist at your waist while you are lifting or carrying a load. If you need to turn, move your feet.  Do not bend over without bending at your knees.  Avoid reaching over your head, across a table, or  for an object on a high surface. WHAT ARE SOME OTHER TIPS?  Avoid wet floors and icy ground. Keep sidewalks clear of ice to prevent  falls.  Do not sleep on a mattress that is too soft or too hard.  Keep items that are used frequently within easy reach.  Put heavier objects on shelves at waist level, and put lighter objects on lower or higher shelves.  Find ways to decrease your stress, such as exercise, massage, or relaxation techniques. Stress can build up in your muscles. Tense muscles are more vulnerable to injury.  Talk with your health care provider if you feel anxious or depressed. These conditions can make back pain worse.  Wear flat heel shoes with cushioned soles.  Avoid sudden movements.  Use both shoulder straps when carrying a backpack.  Do not use any tobacco products, including cigarettes, chewing tobacco, or electronic cigarettes. If you need help quitting, ask your health care provider.   This information is not intended to replace advice given to you by your health care provider. Make sure you discuss any questions you have with your health care provider.   Document Released: 06/12/2004 Document Revised: 09/19/2014 Document Reviewed: 05/09/2014 Elsevier Interactive Patient Education 2016 [employer]  Muscle Cramps and Spasms Muscle cramps and spasms occur when a muscle or muscles tighten and you have no control over this tightening (involuntary muscle contraction). They are a common problem and can develop in any muscle. The most common place is in the calf muscles of the leg. Both muscle cramps and muscle spasms are involuntary muscle contractions, but they also have differences:   Muscle cramps are sporadic and painful. They may last a few seconds to a quarter of an hour. Muscle cramps are often more forceful and last longer than muscle spasms.  Muscle spasms may or may not be painful. They may also last just a few seconds or much longer. CAUSES  It is  uncommon for cramps or spasms to be due to a serious underlying problem. In many cases, the cause of cramps or spasms is unknown. Some common causes are:   Overexertion.   Overuse from repetitive motions (doing the same thing over and over).   Remaining in a certain position for a long period of time.   Improper preparation, form, or technique while performing a sport or activity.   Dehydration.   Injury.   Side effects of some medicines.   Abnormally low levels of the salts and ions in your blood (electrolytes), especially potassium and calcium. This could happen if you are taking water pills (diuretics) or you are pregnant.  Some underlying medical problems can make it more likely to develop cramps or spasms. These include, but are not limited to:   Diabetes.   Parkinson disease.   Hormone disorders, such as thyroid problems.   Alcohol abuse.   Diseases specific to muscles, joints, and bones.   Blood vessel disease where not enough blood is getting to the muscles.  HOME CARE INSTRUCTIONS   Stay well hydrated. Drink enough water and fluids to keep your urine clear or pale yellow.  It may be helpful to massage, stretch, and relax the affected muscle.  For tight or tense muscles, use a warm towel, heating pad, or hot shower water directed to the affected area.  If you are sore or have pain after a cramp or spasm, applying ice to the affected area may relieve discomfort.  Put ice in a plastic bag.  Place a towel between your skin and the bag.  Leave the ice on for 15-20 minutes, 03-04 times a day.  Medicines used to treat a known  cause of cramps or spasms may help reduce their frequency or severity. Only take over-the-counter or prescription medicines as directed by your caregiver. SEEK MEDICAL CARE IF:  Your cramps or spasms get more severe, more frequent, or do not improve over time.  MAKE SURE YOU:   Understand these instructions.  Will watch your  condition.  Will get help right away if you are not doing well or get worse.   This information is not intended to replace advice given to you by your health care provider. Make sure you discuss any questions you have with your health care provider.   Document Released: 10/25/2001 Document Revised: 08/30/2012 Document Reviewed: 04/21/2012 Elsevier Interactive Patient Education [employer].

## 2015-03-15 ENCOUNTER — Encounter: Payer: Self-pay | Admitting: Emergency Medicine

## 2015-03-15 ENCOUNTER — Emergency Department
Admission: EM | Admit: 2015-03-15 | Discharge: 2015-03-15 | Disposition: A | Discharge status: Home / Self Care | Payer: Medicaid Other | Attending: Emergency Medicine | Admitting: Emergency Medicine

## 2015-03-15 DIAGNOSIS — M5412 Radiculopathy, cervical region: Secondary | ICD-10-CM | POA: Insufficient documentation

## 2015-03-15 DIAGNOSIS — Z720 Tobacco use: Secondary | ICD-10-CM | POA: Diagnosis not present

## 2015-03-15 DIAGNOSIS — M25511 Pain in right shoulder: Secondary | ICD-10-CM | POA: Diagnosis present

## 2015-03-15 MED ORDER — CYCLOBENZAPRINE HCL 10 MG PO TABS: 10.0000 mg | ORAL_TABLET | Freq: Three times a day (TID) | ORAL | Status: DC | PRN

## 2015-03-15 MED ORDER — PREDNISONE 10 MG (21) PO TBPK: ORAL_TABLET | ORAL | Status: DC

## 2015-03-15 MED ORDER — TRAMADOL HCL 50 MG PO TABS
50.0000 mg | ORAL_TABLET | Freq: Four times a day (QID) | ORAL | Status: DC | PRN
Start: 2015-03-15 — End: 2017-06-16

## 2015-03-15 NOTE — ED Provider Notes (Signed)
[facility] Emergency Department Provider Note ____________________________________________  Time seen: Approximately 10:29 AM  I have reviewed the triage vital signs and the nursing notes.   HISTORY  Chief Complaint Shoulder Pain   HPI Shawn Green is a 34 y.o. male who presents to the emergency department for evaluation of neck, back, and shoulder pain that radiates into his right hand. He has been evaluated for this pain several times, but has not followed up with PCP or orthopedics. He reports the symptoms today are different because the pain is radiating down the entire right arm. He has not been taking anything for pain. He denies new injury, although has been lifting heavy objects at work.   Past Medical History  Diagnosis Date  . Chronic back pain     There are no active problems to display for this patient.   History reviewed. No pertinent past surgical history.  Current Outpatient Rx  Name  Route  Sig  Dispense  Refill  . cyclobenzaprine (FLEXERIL) 10 MG tablet   Oral   Take 1 tablet (10 mg total) by mouth 3 (three) times daily as needed for muscle spasms.   30 tablet   0   . predniSONE (STERAPRED UNI-PAK 21 TAB) 10 MG (21) TBPK tablet      Take 6 tablets on day 1 Take 5 tablets on day 2 Take 4 tablets on day 3 Take 3 tablets on day 4 Take 2 tablets on day 5 Take 1 tablet on day 6   21 tablet   0   . traMADol (ULTRAM) 50 MG tablet   Oral   Take 1 tablet (50 mg total) by mouth every 6 (six) hours as needed.   9 tablet   0     Allergies Review of patient's allergies indicates no known allergies.  No family history on file.  Social History Social History  Substance Use Topics  . Smoking status: Current Every Day Smoker  . Smokeless tobacco: Never Used  . Alcohol Use: Yes     Comment: social    Review of Systems Constitutional: No recent illness. Eyes: No visual changes. ENT: No sore throat. Cardiovascular: Denies  chest pain or palpitations. Respiratory: Denies shortness of breath. Gastrointestinal: No abdominal pain.  Genitourinary: Negative for dysuria. Musculoskeletal: Pain in right neck, back, shoulder, and right arm Skin: Negative for rash. Neurological: Negative for headaches, focal weakness. Positive for numbness. 10-point ROS otherwise negative.  ____________________________________________   PHYSICAL EXAM:  VITAL SIGNS: ED Triage Vitals  Enc Vitals Group     BP 03/15/15 1016 116/70 mmHg     Pulse Rate 03/15/15 1016 71     Resp 03/15/15 1016 18     Temp 03/15/15 1016 98.4 F (36.9 C)     Temp src --      SpO2 03/15/15 1016 97 %     Weight 03/15/15 1011 200 lb (90.719 kg)     Height 03/15/15 1011 5\' 9"  (1.753 m)     Head Cir --      Peak Flow --      Pain Score 03/15/15 1011 8     Pain Loc --      Pain Edu? --      Excl. in GC? --     Constitutional: Alert and oriented. Well appearing and in no acute distress. Eyes: Conjunctivae are normal. EOMI. Head: Atraumatic. Nose: No congestion/rhinnorhea. Neck: No stridor.  Respiratory: Normal respiratory effort.   Musculoskeletal: FROM of  right shoulder, elbow and hand. Limited ROM of neck due to pain, especially turning to the left. Neurologic:  Normal speech and language. No gross focal neurologic deficits are appreciated. Speech is normal. No gait instability. Skin:  Skin is warm, dry and intact. Atraumatic. Psychiatric: Mood and affect are normal. Speech and behavior are normal.  ____________________________________________   LABS (all labs ordered are listed, but only abnormal results are displayed)  Labs Reviewed - No data to display ____________________________________________  RADIOLOGY  Not indicated ____________________________________________   PROCEDURES  Procedure(s) performed:    ____________________________________________   INITIAL IMPRESSION / ASSESSMENT AND PLAN / ED COURSE  Pertinent labs &  imaging results that were available during my care of the patient were reviewed by me and considered in my medical decision making (see chart for details).  Patient was advised that he will need to call his PCP for a referral to orthopedics. He was advised that the ER does not treat chronic pain.  He will be given Rx for prednisone, flexeril, and Tramadol (#9).  ____________________________________________   FINAL CLINICAL IMPRESSION(S) / ED DIAGNOSES  Final diagnoses:  Cervical radicular pain       Chinita Pester, FNP 03/15/15 1047  Emily Filbert, MD 03/15/15 [phone]

## 2015-03-15 NOTE — ED Notes (Signed)
Having right post shoulder pain which radiates into right arm  Denies injury but has been seen for same

## 2015-03-15 NOTE — Discharge Instructions (Signed)

## 2017-01-25 ENCOUNTER — Emergency Department
Admission: EM | Admit: 2017-01-25 | Discharge: 2017-01-25 | Disposition: A | Discharge status: Home / Self Care | Payer: Medicaid Other | Attending: Emergency Medicine | Admitting: Emergency Medicine

## 2017-01-25 ENCOUNTER — Emergency Department: Payer: Medicaid Other

## 2017-01-25 DIAGNOSIS — Z79899 Other long term (current) drug therapy: Secondary | ICD-10-CM | POA: Insufficient documentation

## 2017-01-25 DIAGNOSIS — F17210 Nicotine dependence, cigarettes, uncomplicated: Secondary | ICD-10-CM | POA: Insufficient documentation

## 2017-01-25 DIAGNOSIS — S40011A Contusion of right shoulder, initial encounter: Secondary | ICD-10-CM

## 2017-01-25 DIAGNOSIS — W500XXA Accidental hit or strike by another person, initial encounter: Secondary | ICD-10-CM | POA: Insufficient documentation

## 2017-01-25 DIAGNOSIS — Y9351 Activity, roller skating (inline) and skateboarding: Secondary | ICD-10-CM | POA: Diagnosis not present

## 2017-01-25 DIAGNOSIS — Y999 Unspecified external cause status: Secondary | ICD-10-CM | POA: Insufficient documentation

## 2017-01-25 DIAGNOSIS — S4991XA Unspecified injury of right shoulder and upper arm, initial encounter: Secondary | ICD-10-CM | POA: Diagnosis present

## 2017-01-25 DIAGNOSIS — Y929 Unspecified place or not applicable: Secondary | ICD-10-CM | POA: Insufficient documentation

## 2017-01-25 DIAGNOSIS — S46911A Strain of unspecified muscle, fascia and tendon at shoulder and upper arm level, right arm, initial encounter: Secondary | ICD-10-CM | POA: Diagnosis not present

## 2017-01-25 MED ORDER — MELOXICAM 15 MG PO TABS: 15.0000 mg | ORAL_TABLET | Freq: Every day | ORAL | 0 refills | Status: DC

## 2017-01-25 NOTE — ED Triage Notes (Signed)
Pt states he was roller skating last night, tripped and fell onto another persons skates and is having right shoulder pain.

## 2017-01-25 NOTE — ED Provider Notes (Signed)
[facility] Emergency Department Provider Note  ____________________________________________  Time seen: Approximately 4:02 PM  I have reviewed the triage vital signs and the nursing notes.   HISTORY  Chief Complaint Shoulder Pain    HPI Shawn Green is a 36 y.o. male Who presents to emergency department complaining of right shoulder pain. Patient reports that he was roller skating with his kids when another skater collided with him causing him to fall on his right shoulder. Patient reports that pain was present immediately but has increased in pain over the intervening period of time.patient has tried no medications at home. Patient denies any numbness or tingling in his hands.patient did not hit his head or lose consciousness. Patient is reporting pain to both the anterior and posterior aspect of his shoulder.   Past Medical History:  Diagnosis Date  . Chronic back pain     There are no active problems to display for this patient.   History reviewed. No pertinent surgical history.  Prior to Admission medications   Medication Sig Start Date End Date Taking? Authorizing Provider  cyclobenzaprine (FLEXERIL) 10 MG tablet Take 1 tablet (10 mg total) by mouth 3 (three) times daily as needed for muscle spasms. 03/15/15   Triplett, Rulon Eisenmenger B, FNP  meloxicam (MOBIC) 15 MG tablet Take 1 tablet (15 mg total) by mouth daily. 01/25/17   Tasheem Elms, Delorise Royals, PA-C  predniSONE (STERAPRED UNI-PAK 21 TAB) 10 MG (21) TBPK tablet Take 6 tablets on day 1 Take 5 tablets on day 2 Take 4 tablets on day 3 Take 3 tablets on day 4 Take 2 tablets on day 5 Take 1 tablet on day 6 03/15/15   Triplett, Cari B, FNP  traMADol (ULTRAM) 50 MG tablet Take 1 tablet (50 mg total) by mouth every 6 (six) hours as needed. 03/15/15   Chinita Pester, FNP    Allergies Patient has no known allergies.  No family history on file.  Social History Social History  Substance Use Topics  .  Smoking status: Current Every Day Smoker  . Smokeless tobacco: Never Used  . Alcohol use Yes     Comment: social     Review of Systems  Constitutional: No fever/chills Eyes: No visual changes.  Cardiovascular: no chest pain. Respiratory: no cough. No SOB. Gastrointestinal: No abdominal pain.  No nausea, no vomiting.  Musculoskeletal: positive for right shoulder pain Skin: Negative for rash, abrasions, lacerations, ecchymosis. Neurological: Negative for headaches, focal weakness or numbness. 10-point ROS otherwise negative.  ____________________________________________   PHYSICAL EXAM:  VITAL SIGNS: ED Triage Vitals [01/25/17 1523]  Enc Vitals Group     BP 123/74     Pulse Rate 83     Resp 17     Temp 98.3 F (36.8 C)     Temp Source Oral     SpO2 99 %     Weight 210 lb (95.3 kg)     Height  (1.753 m)     Head Circumference      Peak Flow      Pain Score 8     Pain Loc      Pain Edu?      Excl. in GC?      Constitutional: Alert and oriented. Well appearing and in no acute distress. Eyes: Conjunctivae are normal. PERRL. EOMI. Head: Atraumatic. Neck: No stridor.  No cervical spine tenderness to palpation.  Cardiovascular: Normal rate, regular rhythm. Normal S1 and S2.  Good peripheral circulation. Respiratory:  Normal respiratory effort without tachypnea or retractions. Lungs CTAB. Good air entry to the bases with no decreased or absent breath sounds. Musculoskeletal: Limited range of motion of the right shoulder due to pain. No visible deformities, ecchymosis, lacerations. Patient is mildly tender to palpation over the lateral aspect of the scapula and the acromioclavicular joint space. No palpable abnormalities. No other tenderness to palpation. Radial pulse intact distally. Sensation intact all 5 digits. Neurologic:  Normal speech and language. No gross focal neurologic deficits are appreciated.  Skin:  Skin is warm, dry and intact. No rash noted. Psychiatric:  Mood and affect are normal. Speech and behavior are normal. Patient exhibits appropriate insight and judgement.   ____________________________________________   LABS (all labs ordered are listed, but only abnormal results are displayed)  Labs Reviewed - No data to display ____________________________________________  EKG   ____________________________________________  RADIOLOGY Festus BarrenI, Masiah Lewing D Nina Mondor, personally viewed and evaluated these images (plain radiographs) as part of my medical decision making, as well as reviewing the written report by the radiologist.  Dg Shoulder Right  Result Date: 01/25/2017 CLINICAL DATA:  Right shoulder pain after fall skating last night. EXAM: RIGHT SHOULDER - 2+ VIEW COMPARISON:  None. FINDINGS: There is no evidence of fracture or dislocation. There is no evidence of arthropathy or other focal bone abnormality. Soft tissues are unremarkable. IMPRESSION: Normal right shoulder. Electronically Signed   By: Lupita RaiderJames  Green Jr, M.D.   On: 01/25/2017 15:53    ____________________________________________    PROCEDURES  Procedure(s) performed:    .Splint Application Date/Time: 01/25/2017 4:15 PM Performed by: Gala RomneyUTHRIELL, Ivanna Kocak D Authorized by: Gala RomneyUTHRIELL, Florestine Carmical D   Consent:    Consent obtained:  Verbal   Consent given by:  Patient   Risks discussed:  Pain Pre-procedure details:    Sensation:  Normal Procedure details:    Laterality:  Right   Location:  Shoulder   Shoulder:  R shoulder   Supplies:  Sling Post-procedure details:    Pain:  Improved   Sensation:  Normal   Patient tolerance of procedure:  Tolerated well, no immediate complications      Medications - No data to display   ____________________________________________   INITIAL IMPRESSION / ASSESSMENT AND PLAN / ED COURSE  Pertinent labs & imaging results that were available during my care of the patient were reviewed by me and considered in my medical decision making  (see chart for details).  Review of the [place] CSRS was performed in accordance of the [facility] prior to dispensing any controlled drugs.     Patient's diagnosis is consistent with right shoulder contusion and strain. X-ray reveals no acute osseous abnormality. Exam was reassuring for no indication of acute muscular tear. Patient is given sling for comfort. Patient will be discharged home with prescriptions for meloxicam for symptom control. Patient is to follow up with primary care or orthopedics as needed or otherwise directed. Patient is given ED precautions to return to the ED for any worsening or new symptoms.     ____________________________________________  FINAL CLINICAL IMPRESSION(S) / ED DIAGNOSES  Final diagnoses:  Contusion of right shoulder, initial encounter  Strain of right shoulder, initial encounter      NEW MEDICATIONS STARTED DURING THIS VISIT:  New Prescriptions   MELOXICAM (MOBIC) 15 MG TABLET    Take 1 tablet (15 mg total) by mouth daily.        This chart was dictated using voice recognition software/Dragon. Despite best efforts to proofread, errors can occur which  can change the meaning. Any change was purely unintentional.    Racheal Patches, PA-C 01/25/17 1619    Jeanmarie Plant, MD 01/25/17 [phone]

## 2017-06-16 ENCOUNTER — Emergency Department
Admission: EM | Admit: 2017-06-16 | Discharge: 2017-06-16 | Disposition: A | Discharge status: Home / Self Care | Payer: Medicaid Other | Attending: Emergency Medicine | Admitting: Emergency Medicine

## 2017-06-16 ENCOUNTER — Other Ambulatory Visit: Payer: Self-pay

## 2017-06-16 ENCOUNTER — Encounter: Payer: Self-pay | Admitting: Emergency Medicine

## 2017-06-16 DIAGNOSIS — Z8719 Personal history of other diseases of the digestive system: Secondary | ICD-10-CM | POA: Diagnosis not present

## 2017-06-16 DIAGNOSIS — R070 Pain in throat: Secondary | ICD-10-CM | POA: Diagnosis present

## 2017-06-16 DIAGNOSIS — K209 Esophagitis, unspecified without bleeding: Secondary | ICD-10-CM

## 2017-06-16 DIAGNOSIS — F17200 Nicotine dependence, unspecified, uncomplicated: Secondary | ICD-10-CM | POA: Insufficient documentation

## 2017-06-16 MED ORDER — GI COCKTAIL ~~LOC~~
ORAL | Status: AC
  Filled 2017-06-16: qty 30

## 2017-06-16 MED ORDER — PANTOPRAZOLE SODIUM 20 MG PO TBEC: 20.0000 mg | DELAYED_RELEASE_TABLET | Freq: Every day | ORAL | 0 refills | Status: AC

## 2017-06-16 MED ORDER — GI COCKTAIL ~~LOC~~
30.0000 mL | Freq: Once | ORAL | Status: AC
  Administered 2017-06-16: 30 mL via ORAL

## 2017-06-16 MED ORDER — FAMOTIDINE IN NACL 20-0.9 MG/50ML-% IV SOLN
20.0000 mg | Freq: Once | INTRAVENOUS | Status: AC
  Administered 2017-06-16: 20 mg via INTRAVENOUS
  Filled 2017-06-16: qty 50

## 2017-06-16 NOTE — ED Triage Notes (Signed)
Presents with sore throat for the past 3-4 days  Pain increases with swallowing  States he drank a lot of alcohol this past weekend  And developed some vomiting  Throat is red   No white patches noted

## 2017-06-16 NOTE — ED Provider Notes (Signed)
[facility] Emergency Department Provider Note  ____________________________________________   First MD Initiated Contact with Patient 06/16/17 1229     (approximate)  I have reviewed the triage vital signs and the nursing notes.   HISTORY  Chief Complaint Sore Throat  HPI Shawn Green is a 37 y.o. male is here with complaint of reflux symptoms.  Patient states he has had a sore throat for the last 3-4 days that increases with swallowing.  He has a history of hiatal hernia and in the past was diagnosed with GERD.  He was given a prescription for medication which he never picked up.  He states that he has had an episode of vomiting.  He admits to drinking 1/5 of tequila 3 nights ago.  He has not taken any over-the-counter medication for his throat.  He denies any fever or chills.  He has not been exposed to strep throat.   Past Medical History:  Diagnosis Date  . Chronic back pain     There are no active problems to display for this patient.   History reviewed. No pertinent surgical history.  Prior to Admission medications   Medication Sig Start Date End Date Taking? Authorizing Provider  pantoprazole (PROTONIX) 20 MG tablet Take 1 tablet (20 mg total) by mouth daily. 06/16/17 06/16/18  Tommi RumpsSummers, Edyn Qazi L, PA-C    Allergies Patient has no known allergies.  No family history on file.  Social History Social History   Tobacco Use  . Smoking status: Current Every Day Smoker  . Smokeless tobacco: Never Used  Substance Use Topics  . Alcohol use: Yes    Comment: social  . Drug use: No    Review of Systems Constitutional: No fever/chills Eyes: No visual changes. ENT: Positive sore throat. Cardiovascular: Denies chest pain. Respiratory: Denies shortness of breath. Gastrointestinal: No abdominal pain.  No nausea, positive vomiting.  Neurological: Negative for headaches, focal weakness or  numbness. ____________________________________________   PHYSICAL EXAM:  VITAL SIGNS: ED Triage Vitals  Enc Vitals Group     BP 06/16/17 1224 124/79     Pulse Rate 06/16/17 1224 68     Resp --      Temp 06/16/17 1224 97.9 F (36.6 C)     Temp Source 06/16/17 1224 Oral     SpO2 06/16/17 1224 97 %     Weight --      Height --      Head Circumference --      Peak Flow --      Pain Score 06/16/17 1232 7     Pain Loc --      Pain Edu? --      Excl. in GC? --    Constitutional: Alert and oriented. Well appearing and in no acute distress. Eyes: Conjunctivae are normal.  Head: Atraumatic. Nose: No congestion/rhinnorhea. Mouth/Throat: Mucous membranes are moist.  Oropharynx mild injection but no erythema or exudate was noted. Neck: No stridor.   Hematological/Lymphatic/Immunilogical: No cervical lymphadenopathy. Cardiovascular: Normal rate, regular rhythm. Grossly normal heart sounds.  Good peripheral circulation. Respiratory: Normal respiratory effort.  No retractions. Lungs CTAB. Gastrointestinal: Soft and nontender. No distention.  Bowel sounds normoactive and no epigastric tenderness is noted to palpation. Musculoskeletal: Moves upper and lower extremities without any difficulty and normal gait was noted. Neurologic:  Normal speech and language. No gross focal neurologic deficits are appreciated. No gait instability. Skin:  Skin is warm, dry and intact. No rash noted. Psychiatric: Mood and affect are  normal. Speech and behavior are normal.  ____________________________________________   LABS (all labs ordered are listed, but only abnormal results are displayed)  Labs Reviewed - No data to display  PROCEDURES  Procedure(s) performed: None  Procedures  Critical Care performed: No  ____________________________________________   INITIAL IMPRESSION / ASSESSMENT AND PLAN / ED COURSE  Patient was given GI cocktail with viscous lidocaine and Pepcid 20 mg IV while in the  department.  We discussed no alcoholic beverages in the future and no spicy foods.  He is to follow-up with his PCP at [facility] if any continued problems for referral to a gastroenterologist for further studies.  Patient was discharged with a prescription for Protonix 20 mg to be taken once a day.  He is encouraged to make an appointment for follow-up in 2 weeks. ____________________________________________   FINAL CLINICAL IMPRESSION(S) / ED DIAGNOSES  Final diagnoses:  Chemical esophagitis  H/O gastroesophageal reflux (GERD)     ED Discharge Orders        Ordered    pantoprazole (PROTONIX) 20 MG tablet  Daily     06/16/17 1458       Note:  This document was prepared using Dragon voice recognition software and may include unintentional dictation errors.    Tommi Rumps, PA-C 06/16/17 1508    Pershing Proud Myra Rude, MD 06/16/17 [phone]

## 2017-06-16 NOTE — Discharge Instructions (Signed)
Begin taking Protonix once a day every day and make an appointment to be [facility] for [facility].  If not improving you may need to see a gastroenterologist.  Your doctor at [facility] will need to make the referral for you.  Do not drink any alcohol or eat spicy food during this time.

## 2017-07-30 ENCOUNTER — Emergency Department: Payer: Medicaid Other

## 2017-07-30 ENCOUNTER — Encounter: Payer: Self-pay | Admitting: Emergency Medicine

## 2017-07-30 ENCOUNTER — Emergency Department
Admission: EM | Admit: 2017-07-30 | Discharge: 2017-07-30 | Disposition: A | Discharge status: Home / Self Care | Payer: Medicaid Other | Attending: Emergency Medicine | Admitting: Emergency Medicine

## 2017-07-30 DIAGNOSIS — R339 Retention of urine, unspecified: Secondary | ICD-10-CM | POA: Insufficient documentation

## 2017-07-30 DIAGNOSIS — M5432 Sciatica, left side: Secondary | ICD-10-CM | POA: Diagnosis not present

## 2017-07-30 DIAGNOSIS — F17210 Nicotine dependence, cigarettes, uncomplicated: Secondary | ICD-10-CM | POA: Insufficient documentation

## 2017-07-30 DIAGNOSIS — G8929 Other chronic pain: Secondary | ICD-10-CM | POA: Diagnosis not present

## 2017-07-30 DIAGNOSIS — M549 Dorsalgia, unspecified: Secondary | ICD-10-CM

## 2017-07-30 DIAGNOSIS — Z5309 Procedure and treatment not carried out because of other contraindication: Secondary | ICD-10-CM

## 2017-07-30 HISTORY — DX: Diaphragmatic hernia without obstruction or gangrene: K44.9

## 2017-07-30 LAB — URINALYSIS, COMPLETE (UACMP) WITH MICROSCOPIC
Bacteria, UA: NONE SEEN
Bilirubin Urine: NEGATIVE
GLUCOSE, UA: NEGATIVE mg/dL
HGB URINE DIPSTICK: NEGATIVE
KETONES UR: NEGATIVE mg/dL
Leukocytes, UA: NEGATIVE
NITRITE: NEGATIVE
PH: 6 (ref 5.0–8.0)
PROTEIN: NEGATIVE mg/dL
Specific Gravity, Urine: 1.024 (ref 1.005–1.030)
Squamous Epithelial / LPF: NONE SEEN

## 2017-07-30 LAB — CBC WITH DIFFERENTIAL/PLATELET
BASOS ABS: 0.1 10*3/uL (ref 0–0.1)
BASOS PCT: 1 %
EOS PCT: 8 %
Eosinophils Absolute: 0.5 10*3/uL (ref 0–0.7)
HEMATOCRIT: 43.5 % (ref 40.0–52.0)
Hemoglobin: 14.9 g/dL (ref 13.0–18.0)
LYMPHS PCT: 24 %
Lymphs Abs: 1.4 10*3/uL (ref 1.0–3.6)
MCH: 29.9 pg (ref 26.0–34.0)
MCHC: 34.2 g/dL (ref 32.0–36.0)
MCV: 87.4 fL (ref 80.0–100.0)
MONO ABS: 0.5 10*3/uL (ref 0.2–1.0)
MONOS PCT: 9 %
NEUTROS ABS: 3.4 10*3/uL (ref 1.4–6.5)
Neutrophils Relative %: 58 %
PLATELETS: 299 10*3/uL (ref 150–440)
RBC: 4.98 MIL/uL (ref 4.40–5.90)
RDW: 12.4 % (ref 11.5–14.5)
WBC: 5.9 10*3/uL (ref 3.8–10.6)

## 2017-07-30 LAB — BASIC METABOLIC PANEL
Anion gap: 8 (ref 5–15)
BUN: 12 mg/dL (ref 6–20)
CALCIUM: 8.4 mg/dL — AB (ref 8.9–10.3)
CO2: 24 mmol/L (ref 22–32)
CREATININE: 0.88 mg/dL (ref 0.61–1.24)
Chloride: 107 mmol/L (ref 101–111)
GFR calc Af Amer: >60 mL/min (ref >60)
GLUCOSE: 118 mg/dL — AB (ref 65–99)
Potassium: 4 mmol/L (ref 3.5–5.1)
Sodium: 139 mmol/L (ref 135–145)

## 2017-07-30 MED ORDER — LORAZEPAM 2 MG/ML IJ SOLN
INTRAMUSCULAR | Status: AC
  Administered 2017-07-30: 1 mg via INTRAVENOUS
  Filled 2017-07-30: qty 1

## 2017-07-30 MED ORDER — OXYCODONE-ACETAMINOPHEN 5-325 MG PO TABS
ORAL_TABLET | ORAL | Status: AC
  Filled 2017-07-30: qty 1

## 2017-07-30 MED ORDER — MORPHINE SULFATE (PF) 4 MG/ML IV SOLN
INTRAVENOUS | Status: AC
  Administered 2017-07-30: 4 mg via INTRAVENOUS
  Filled 2017-07-30: qty 1

## 2017-07-30 MED ORDER — CARISOPRODOL 350 MG PO TABS: 350.0000 mg | ORAL_TABLET | Freq: Three times a day (TID) | ORAL | 0 refills | Status: AC | PRN

## 2017-07-30 MED ORDER — MORPHINE SULFATE (PF) 4 MG/ML IV SOLN
4.0000 mg | Freq: Once | INTRAVENOUS | Status: AC
  Administered 2017-07-30: 4 mg via INTRAVENOUS

## 2017-07-30 MED ORDER — NAPROXEN 375 MG PO TABS: 375.0000 mg | ORAL_TABLET | Freq: Two times a day (BID) | ORAL | 0 refills | Status: AC | PRN

## 2017-07-30 MED ORDER — PREDNISONE 20 MG PO TABS
60.0000 mg | ORAL_TABLET | Freq: Once | ORAL | Status: AC
  Administered 2017-07-30: 60 mg via ORAL
  Filled 2017-07-30: qty 3

## 2017-07-30 MED ORDER — METHYLPREDNISOLONE 4 MG PO TBPK: ORAL_TABLET | ORAL | 0 refills | Status: AC

## 2017-07-30 MED ORDER — LORAZEPAM 2 MG/ML IJ SOLN
1.0000 mg | Freq: Once | INTRAMUSCULAR | Status: AC
  Administered 2017-07-30: 1 mg via INTRAVENOUS

## 2017-07-30 MED ORDER — OXYCODONE-ACETAMINOPHEN 5-325 MG PO TABS
1.0000 | ORAL_TABLET | Freq: Once | ORAL | Status: AC
  Administered 2017-07-30: 1 via ORAL

## 2017-07-30 MED ORDER — ONDANSETRON HCL 4 MG/2ML IJ SOLN
4.0000 mg | Freq: Once | INTRAMUSCULAR | Status: AC
  Administered 2017-07-30: 4 mg via INTRAVENOUS

## 2017-07-30 MED ORDER — ONDANSETRON HCL 4 MG/2ML IJ SOLN
INTRAMUSCULAR | Status: AC
  Administered 2017-07-30: 4 mg via INTRAVENOUS
  Filled 2017-07-30: qty 2

## 2017-07-30 NOTE — ED Notes (Signed)
Pt left without being discharged. Pt called back and informed that he needed to return to have IV removal verified and discharge paperwork provided.

## 2017-07-30 NOTE — ED Notes (Signed)
Pt has urine cup waiting for urine specimen at this time

## 2017-07-30 NOTE — ED Notes (Signed)
Patient transported to MRI 

## 2017-07-30 NOTE — ED Provider Notes (Signed)
[facility] Emergency Department Provider Note  ___________________________________________   First MD Initiated Contact with Patient 07/30/17 1631     (approximate)  I have reviewed the triage vital signs and the nursing notes.   HISTORY  Chief Complaint Back Pain and Dysuria   HPI Davison Lacretia Nicks Selley is a 37 y.o. male with a history of chronic back pain and a hiatal hernia who is presenting to the emergency department today with worsening back pain especially to the lower back over the past 2 days and urinary retention.  He says that he feels like he has to urinate but is only passing a small amount of urine.  He says this has been going on for years but has worsened over the past 2 days ever since his wife pushed on his back and felt a small knot in the left pelvic area.  He denies any injury.  Says that he is been working in [employer] but denies any recent heavy or lifting been normal.  Says that the back pain is 7 out of 8 and extends from his lower lumbar and sacral region all the way to his neck.  Denies IV drug use.  Denies recent fever or chills.  Says that he is also had a 20 pound weight loss over the past several weeks but says that he has not been eating well since having the flu several weeks ago.   Past Medical History:  Diagnosis Date  . Chronic back pain   . Hiatal hernia     There are no active problems to display for this patient.   History reviewed. No pertinent surgical history.  Prior to Admission medications   Medication Sig Start Date End Date Taking? Authorizing Provider  pantoprazole (PROTONIX) 20 MG tablet Take 1 tablet (20 mg total) by mouth daily. 06/16/17 06/16/18  Tommi Rumps, PA-C    Allergies Patient has no known allergies.  No family history on file.  Social History Social History   Tobacco Use  . Smoking status: Current Every Day Smoker  . Smokeless tobacco: Never Used  Substance Use Topics  . Alcohol use:  Yes    Comment: social  . Drug use: No    Review of Systems  Constitutional: No fever/chills Eyes: No visual changes. ENT: No sore throat. Cardiovascular: Denies chest pain. Respiratory: Denies shortness of breath. Gastrointestinal: No abdominal pain.  No nausea, no vomiting.  No diarrhea.  No constipation. Genitourinary: As above Musculoskeletal: As above Skin: Negative for rash. Neurological: Negative for headaches, focal weakness or numbness.   ____________________________________________   PHYSICAL EXAM:  VITAL SIGNS: ED Triage Vitals  Enc Vitals Group     BP 07/30/17 1618 (!) 111/94     Pulse Rate 07/30/17 1618 90     Resp 07/30/17 1618 16     Temp 07/30/17 1618 98.2 F (36.8 C)     Temp Source 07/30/17 1618 Oral     SpO2 07/30/17 1618 100 %     Weight 07/30/17 1619 200 lb (90.7 kg)     Height 07/30/17 1619 5\' 9"  (1.753 m)     Head Circumference --      Peak Flow --      Pain Score 07/30/17 1618 8     Pain Loc --      Pain Edu? --      Excl. in GC? --     Constitutional: Alert and oriented. Well appearing and in no acute distress. Eyes: Conjunctivae  are normal.  Head: Atraumatic. Nose: No congestion/rhinnorhea. Mouth/Throat: Mucous membranes are moist.  Neck: No stridor.   Cardiovascular: Normal rate, regular rhythm. Grossly normal heart sounds.  Good peripheral circulation with equal and bilateral posterior tibial pulses. Respiratory: Normal respiratory effort.  No retractions. Lungs CTAB. Gastrointestinal: Soft and nontender. No distention. No CVA tenderness. Musculoskeletal: No lower extremity tenderness nor edema.  No joint effusions.  Tenderness to palpation diffusely to the midline of the back including the lumbar, thoracic and cervical spines.  No step-off.  I do not palpate the "knot" that was described in the sacral region on the left. Neurologic:  Normal speech and language. No gross focal neurologic deficits are appreciated.  5 out of 5 strength  bilateral lower extremities.  No saddle anesthesia. Skin:  Skin is warm, dry and intact. No rash noted. Psychiatric: Mood and affect are normal. Speech and behavior are normal.  ____________________________________________   LABS (all labs ordered are listed, but only abnormal results are displayed)  Labs Reviewed  URINALYSIS, COMPLETE (UACMP) WITH MICROSCOPIC - Abnormal; Notable for the following components:      Result Value   Color, Urine YELLOW (*)    APPearance CLEAR (*)    All other components within normal limits  BASIC METABOLIC PANEL - Abnormal; Notable for the following components:   Glucose, Bld 118 (*)    Calcium 8.4 (*)    All other components within normal limits  CBC WITH DIFFERENTIAL/PLATELET   ____________________________________________  EKG   ____________________________________________  RADIOLOGY  MRI of the cervical spine without acute abnormality.  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  Mild bilateral C7 foraminal stenosis related to disc bulge and  uncovertebral hypertrophy.  Normal thoracic and lumbar spine.   ____________________________________________   PROCEDURES  Procedure(s) performed:   Procedures  Critical Care performed:   ____________________________________________   INITIAL IMPRESSION / ASSESSMENT AND PLAN / ED COURSE  Pertinent labs & imaging results that were available during my care of the patient were reviewed by me and considered in my medical decision making (see chart for details).  DDX: Spinal compression, radiculopathy, spinal fracture, herniated disc, spinal stenosis, urinary retention As part of my medical decision making, I reviewed the following data within the electronic MEDICAL RECORD NUMBER Notes from prior ED visits  ----------------------------------------- 9:17 PM on 07/30/2017 -----------------------------------------  Patient able to ambulate about the room  with a small limp.  Says that the pain does shoot down the back of his left leg to his foot.  I will treat the patient for sciatica with steroids and also give him a muscle relaxer.  I also advised that he take Naprosyn for pain control.  He is now saying that he has had these symptoms once a month for the past 8 years and was one seen at [facility] of the symptoms but has stopped seeing them.  He never went to physical therapy but he said it was recommended at one point.  I recommended that he get back in with primary care and possibly restart discussions with physical therapy.  No signs of cauda equina at this time.  Discussed the MRI results as well as the treatment plan with the patient and family and they are understanding and willing to comply.  Patient also refused Foley catheter but appears to be able to void. ____________________________________________   FINAL CLINICAL IMPRESSION(S) / ED DIAGNOSES  Sciatica.  Back pain.    NEW MEDICATIONS STARTED DURING  THIS VISIT:  New Prescriptions   No medications on file     Note:  This document was prepared using Dragon voice recognition software and may include unintentional dictation errors.     Myrna BlazerSchaevitz, Kyndall Amero Matthew, MD 07/30/17 2118

## 2017-07-30 NOTE — ED Triage Notes (Signed)
Patient reports lower back pain and "slow urination" for "years".  Patient reports the pain and urinary issues are increasing in severity.  Patient reports feeling very tired all the time.  Patient also reports numbness and tingling that radiates into his legs.  Patient states, "I talked to somebody I know who I a nurse, and she thinks it's sounds like a cyst or a tumor and thought I probably need an MRI."  Patient's girlfriend reports massaging patient's back recently and felt a "lump that moved."  Patient ambulatory to triage slowly with no obvious distress.

## 2018-06-29 ENCOUNTER — Other Ambulatory Visit: Payer: Self-pay

## 2018-06-29 ENCOUNTER — Emergency Department
Admission: EM | Admit: 2018-06-29 | Discharge: 2018-06-29 | Disposition: A | Discharge status: Home / Self Care | Payer: Medicaid Other | Attending: Emergency Medicine | Admitting: Emergency Medicine

## 2018-06-29 ENCOUNTER — Encounter: Payer: Self-pay | Admitting: Emergency Medicine

## 2018-06-29 DIAGNOSIS — M25511 Pain in right shoulder: Secondary | ICD-10-CM | POA: Insufficient documentation

## 2018-06-29 DIAGNOSIS — Z5321 Procedure and treatment not carried out due to patient leaving prior to being seen by health care provider: Secondary | ICD-10-CM | POA: Insufficient documentation

## 2018-06-29 NOTE — ED Triage Notes (Signed)
Patient ambulatory to triage with steady gait, without difficulty or distress noted; pt reports x 2 days having right shoulder pain, st seen for same before and dx with "pinched nerve"; st yr ago had an injury where shoulder was dislocated

## 2018-06-29 NOTE — ED Notes (Signed)
Pt c/o right shoulder pain after lifting heavy objects at home. Pt reports that this a recurrent issue.

## 2018-06-29 NOTE — ED Notes (Signed)
Pt left without being seen.

## 2019-08-12 IMAGING — MR MR THORACIC SPINE W/O CM
4 of 6 series · 27 of 48 positions shown · non-contrast
Comparison: None available.

CLINICAL DATA: Initial evaluation for worsening lower back pain,
difficulty with micturition, and bilateral lower extremity numbness
and tingling.

EXAM:
MRI CERVICAL, THORACIC AND LUMBAR SPINE WITHOUT CONTRAST
TECHNIQUE: Multiplanar and multiecho pulse sequences of the cervical spine, to
include the craniocervical junction and cervicothoracic junction,
and thoracic and lumbar spine, were obtained without intravenous
contrast.

[Series 4: T2 · sagittal · 3.0mm · 1.25mm/px · 7 of 19 slices shown (1 of 2)]
[im 1/19]
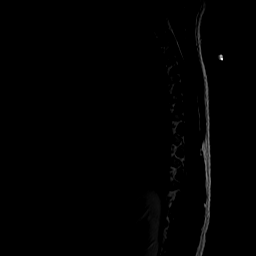
[im 4/19]
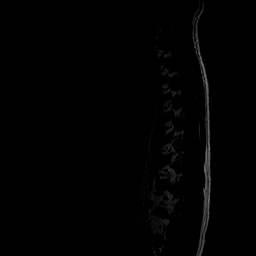
[im 7/19]
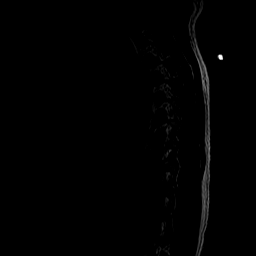
[im 10/19]
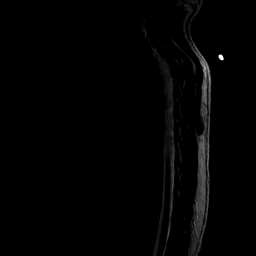
[im 13/19]
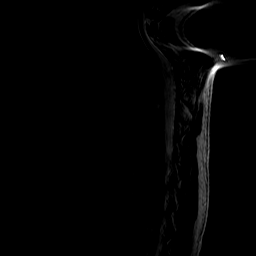
[im 16/19]
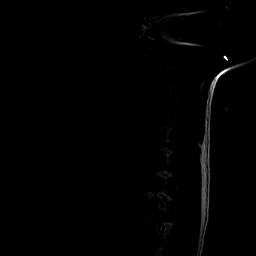
[im 19/19]
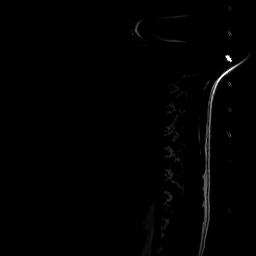

[Series 5: T1 · sagittal · 3.0mm · 0.62mm/px · 7 of 19 slices shown]
[im 1/19]
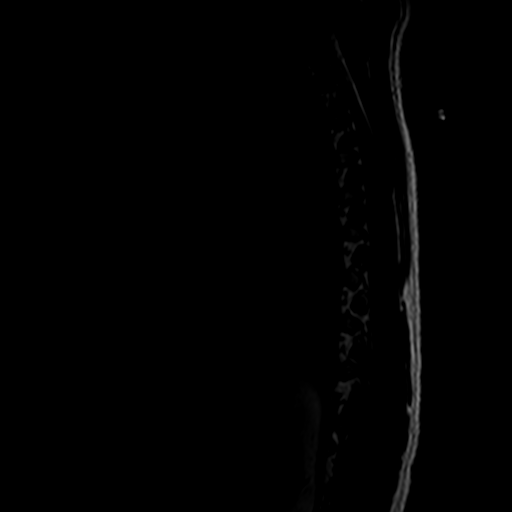
[im 4/19]
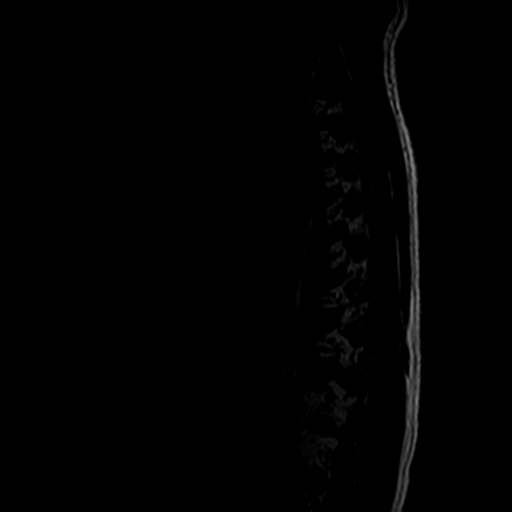
[im 7/19]
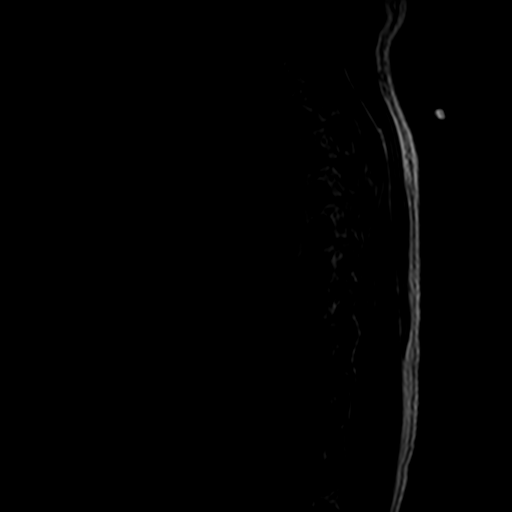
[im 10/19]
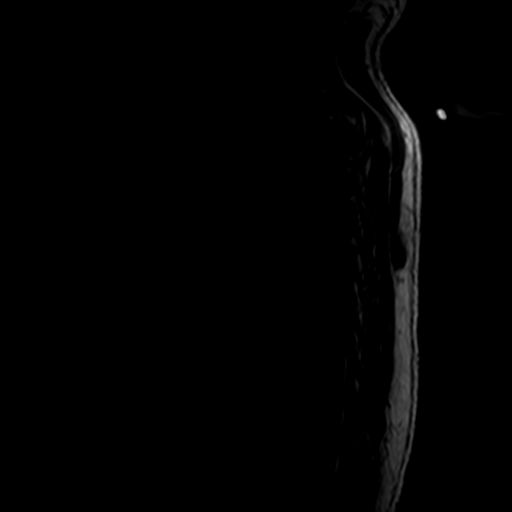
[im 13/19]
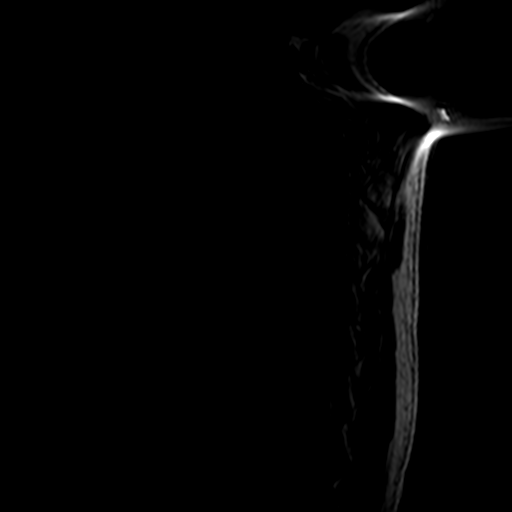
[im 16/19]
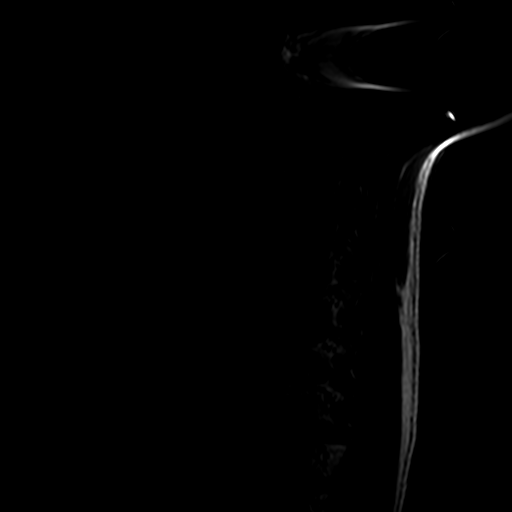
[im 19/19]
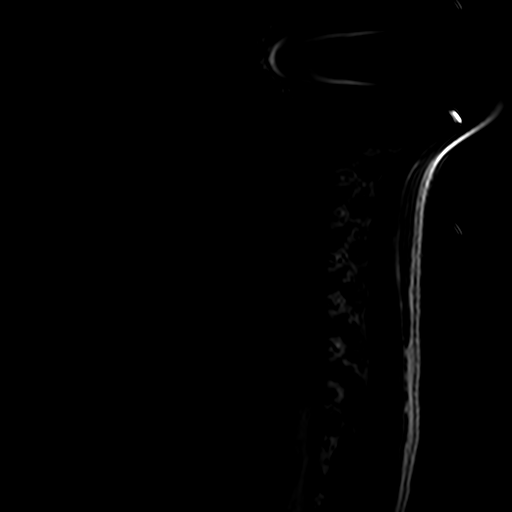

[Series 6: STIR · sagittal · 3.0mm · 0.62mm/px · 4 of 19 slices shown]
[im 1/19]
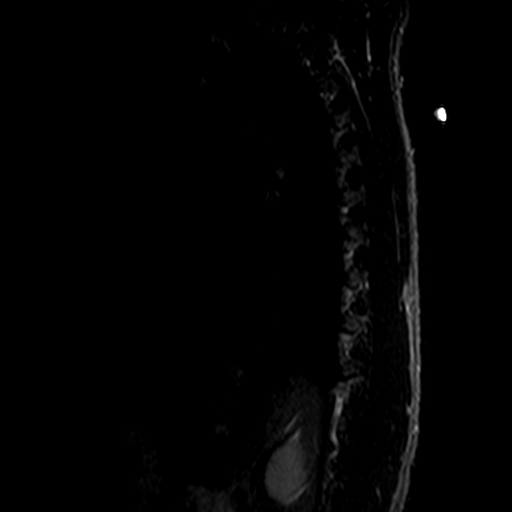
[im 4/19]
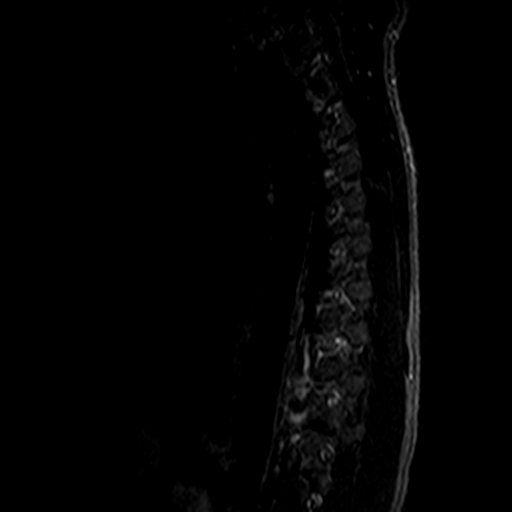
[im 10/19]
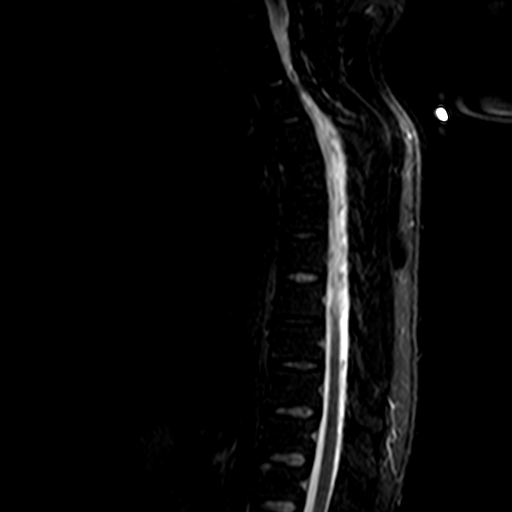
[im 16/19]
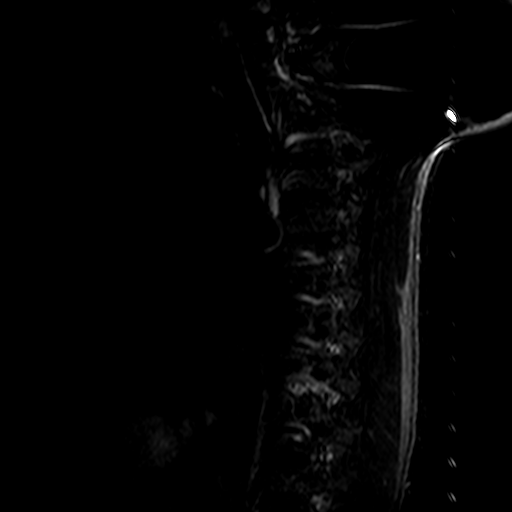

[Series 7: T2 · axial · 5.0mm · 0.86mm/px · z∈[-180,+105]mm · 9 of 39 slices shown (2 of 2)]
[im 1/39]
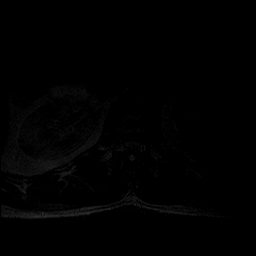
[im 7/39]
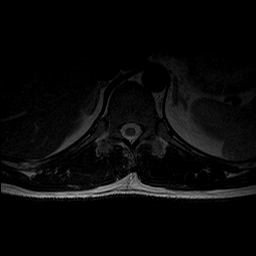
[im 13/39]
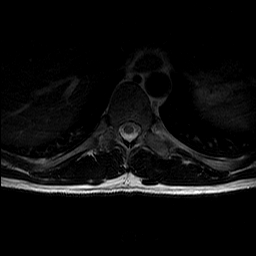
[im 16/39]
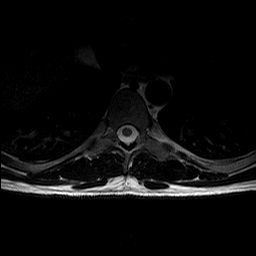
[im 20/39]
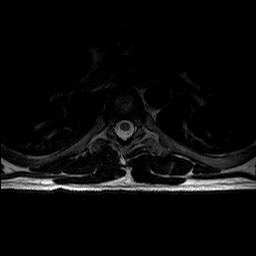
[im 23/39]
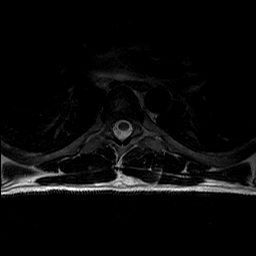
[im 26/39]
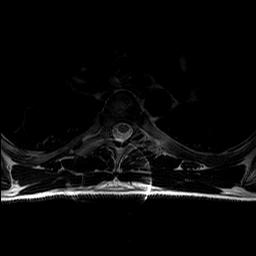
[im 32/39]
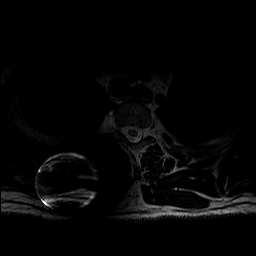
[im 39/39]
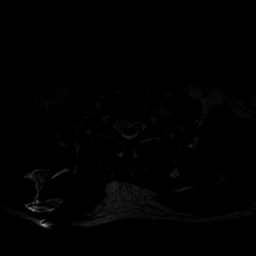

[27 of 48 positions shown; findings below may reference images not displayed]

FINDINGS: MRI CERVICAL SPINE FINDINGS

Alignment: Straightening of the normal cervical lordosis. No
listhesis.

Vertebrae: Vertebral body heights are maintained without evidence
for acute or chronic fracture. Bone marrow signal intensity within
normal limits. Mild reactive endplate changes present about the C6-7
interspace. No discrete or worrisome osseous lesions.

Cord: Signal intensity within the cervical spinal cord is within
normal limits. Cord caliber normal.

Posterior Fossa, vertebral arteries, paraspinal tissues: Partially
visualized brain and posterior fossa within normal limits.
Craniocervical junction normal. Susceptibility artifact from
retained metallic BB present within the soft tissues of the right
upper back. Paraspinous soft tissues demonstrate no acute
abnormality. Normal intravascular flow voids seen within the
vertebral arteries bilaterally.

Disc levels:

C2-C3: Unremarkable.

C3-C4: Minimal uncovertebral hypertrophy without disc bulge. No
stenosis.

C4-C5: Mild diffuse disc bulge with uncovertebral hypertrophy. No
canal or foraminal stenosis.

C5-C6: Shallow 3 mm central disc protrusion indents and flattens the
ventral thecal sac, slightly eccentric to the right. No significant
stenosis or cord deformity. Foramina remain patent.

C6-C7: Mild diffuse disc bulge with bilateral uncovertebral
hypertrophy. Flattening of the ventral CSF without stenosis or cord
deformity. Superimposed bilateral uncovertebral hypertrophy with
resultant mild bilateral C7 foraminal stenosis.

C7-T1:  Unremarkable.

MRI THORACIC SPINE FINDINGS

Alignment: Examination mildly limited by extensive susceptibility
artifact from metallic BB within the upper right paraspinous soft
tissues.

Vertebral bodies grossly normally aligned with preservation of the
normal thoracic kyphosis. No listhesis.

Vertebrae: Vertebral body heights are maintained without evidence
for acute or chronic fracture. Bone marrow signal intensity within
normal limits. Single 8 mm benign hemangioma noted within the
posterior T6 vertebral body. No other discrete or worrisome osseous
lesions. No abnormal marrow edema.

Cord: Thoracic spinal cord is within normal limits without signal
abnormality. Please note evaluation mildly limited by susceptibility
artifact within the upper back. Cord caliber normal.

Paraspinal and other soft tissues: No acute paraspinous soft tissue
abnormality. Partially visualized lungs are clear. Visualized
visceral structures unremarkable.

Disc levels:

No significant disc pathology within the thoracic spine. Distal well
hydrated with preserved disc height. No disc bulge or disc
protrusion. No significant facet disease. No canal or foraminal
stenosis.

MRI LUMBAR SPINE FINDINGS

Segmentation: Normal segmentation. Lowest well-formed disc labeled
the L5-S1 level.

Alignment: Vertebral bodies normally aligned with preservation of
the normal lumbar lordosis. No listhesis.

Vertebrae: Vertebral body heights well maintained without evidence
for acute or chronic fracture. Bone marrow signal intensity within
normal limits. No discrete or worrisome osseous lesions. No abnormal
marrow edema.

Conus medullaris and cauda equina: Conus extends to the L1 level.
Conus and cauda equina appear normal.

Paraspinal and other soft tissues: Paraspinous soft tissues within
normal limits. Visualized visceral structures within normal limits.
Subcentimeter T1 hyperintensity within the interpolar right kidney
noted (series 6, image 2), not well seen on corresponding T2
sequence, and may be artifactual or possibly reflect a tiny
proteinaceous and/or hemorrhagic cyst. Visualized visceral
structures are otherwise unremarkable.

Disc levels:

No significant disc pathology seen within the lumbar spine.
Intervertebral discs are well hydrated with preserved disc height.
No significant disc bulge or focal disc protrusion. No facet
hypertrophy. No canal or neural foraminal stenosis. No evidence for
neural impingement.
IMPRESSION: MRI CERVICAL SPINE IMPRESSION

1. No acute abnormality within the cervical spine.
2. Mild cervical spondylolysis at C3-4 through C6-7 without
significant spinal stenosis.
3. Mild bilateral C7 foraminal stenosis related to disc bulge and
uncovertebral hypertrophy.

MRI THORACIC SPINE IMPRESSION

Normal MRI of the thoracic spine.

MRI LUMBAR SPINE IMPRESSION

Normal MRI of the lumbar spine.

## 2019-11-08 ENCOUNTER — Emergency Department
Admission: EM | Admit: 2019-11-08 | Discharge: 2019-11-08 | Discharge status: Against Medical Advice | Payer: Medicaid Other
